# Patient Record
Sex: Female | Born: 2005 | Hispanic: Yes | Smoking: Never smoker
Health system: Southern US, Community
[De-identification: ages and names within clinical notes are randomized; demographics above are authoritative.]

## PROBLEM LIST (undated history)

## (undated) DIAGNOSIS — K37 Unspecified appendicitis: Secondary | ICD-10-CM

## (undated) HISTORY — PX: APPENDECTOMY: SHX54

## (undated) HISTORY — DX: Unspecified appendicitis: K37

---
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
## 2023-09-05 IMAGING — CR DG ABDOMEN 2V
3 series · 3 of 3 positions shown · non-contrast
Comparison: None.

CLINICAL DATA: Cramping in the abdominal area

EXAM:
ABDOMEN - 2 VIEW

[abdomen erect]
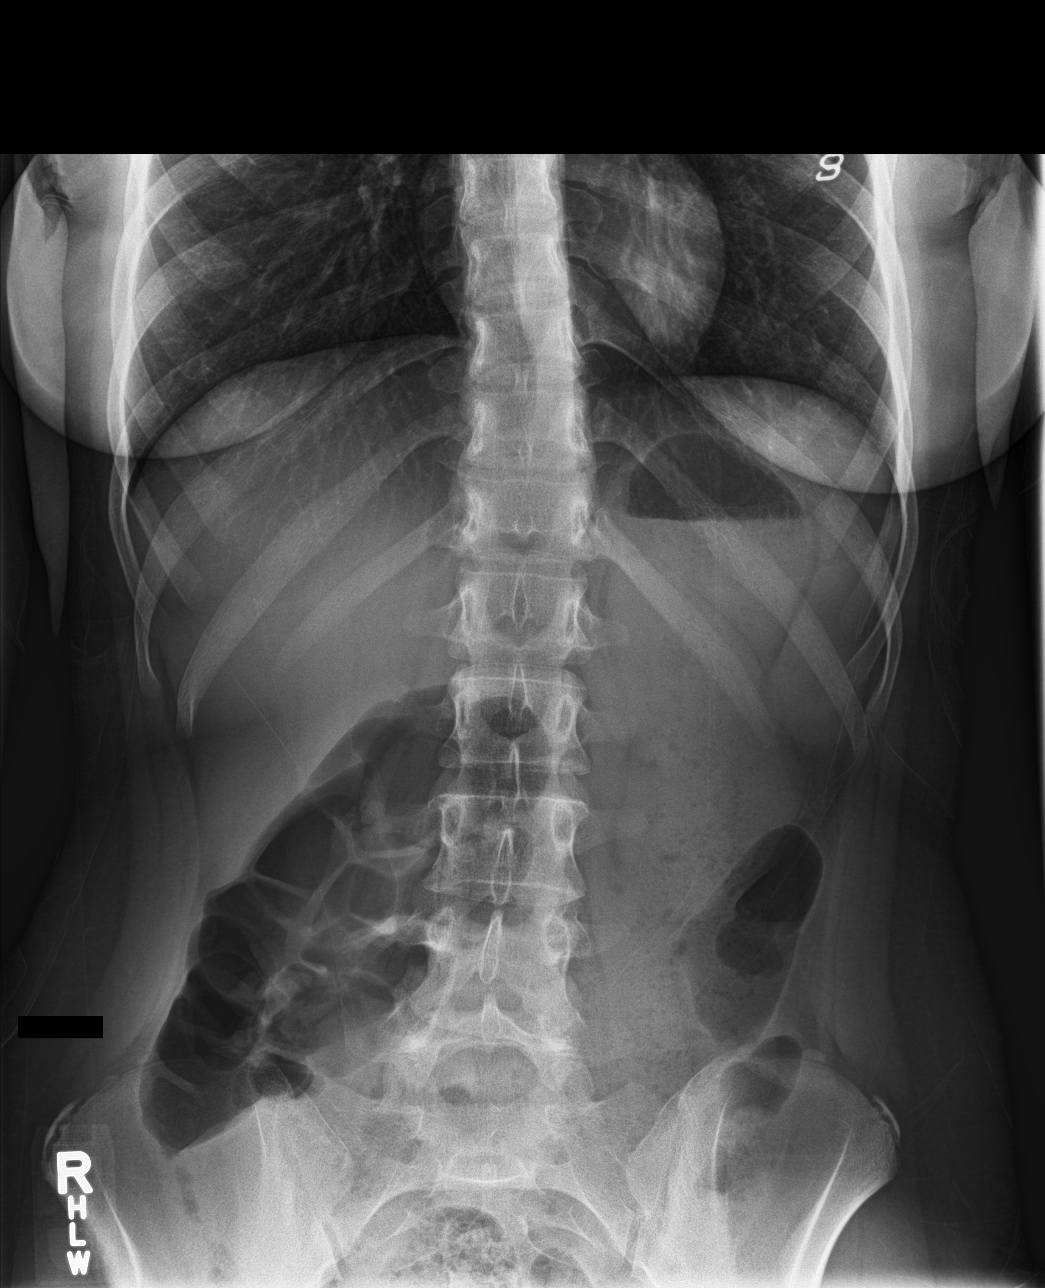

[abdomen supine (1 of 2)]
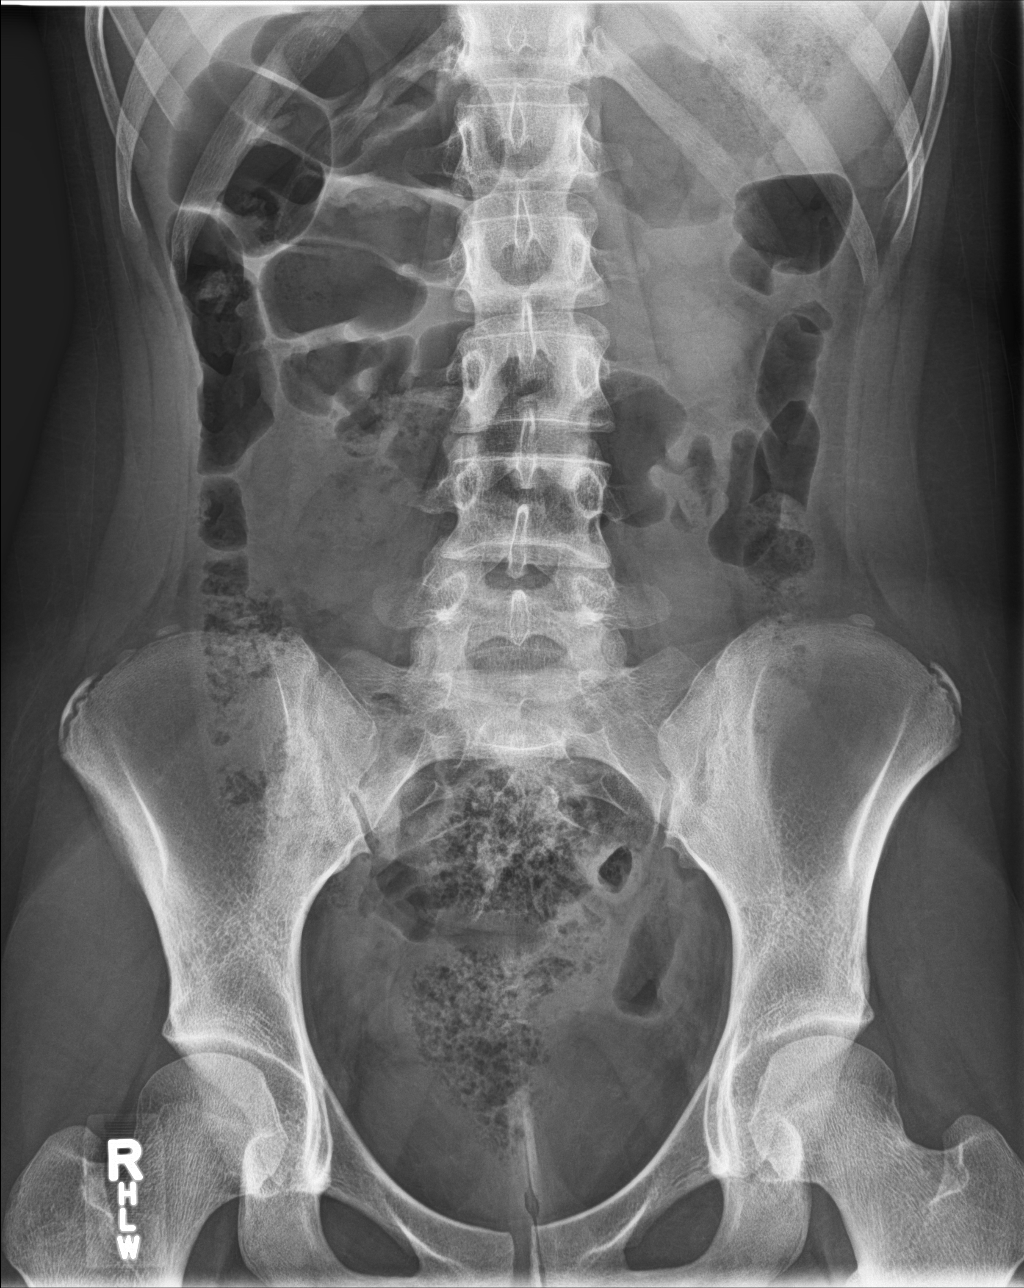

[abdomen supine (2 of 2)]
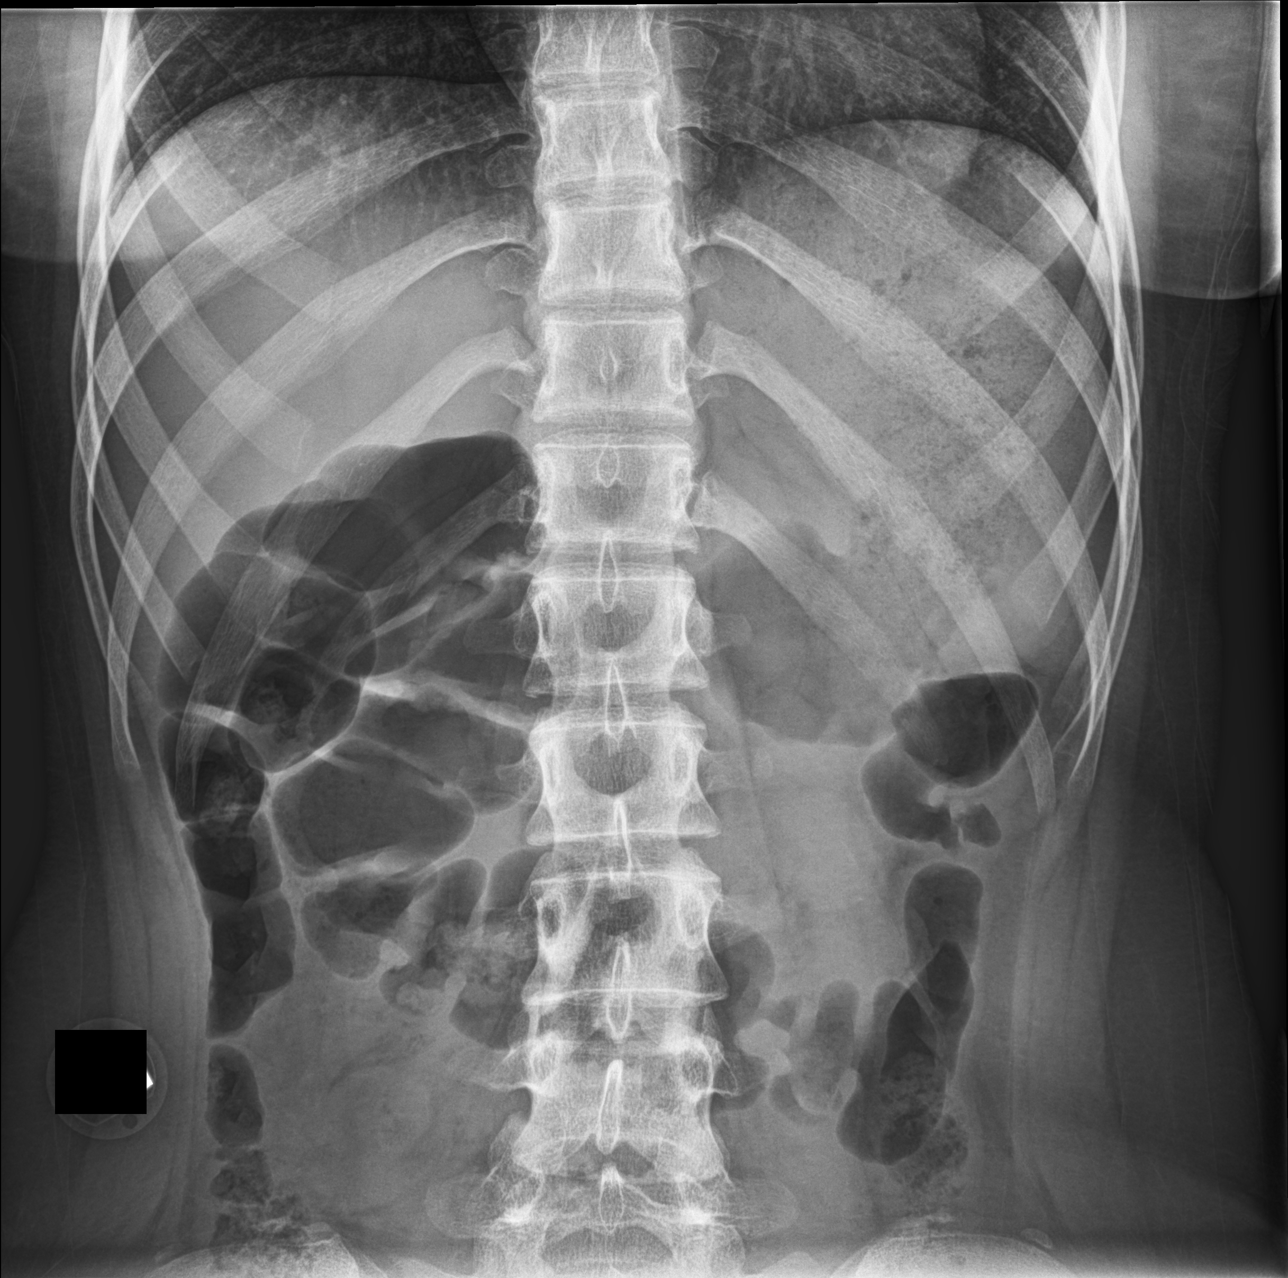

[3 of 3 positions shown; findings below may reference images not displayed]

FINDINGS: The bowel gas pattern is normal. There is no evidence of free air.
No radio-opaque calculi or other significant radiographic
abnormality is seen. Mild to moderate stool in the colon.
IMPRESSION: Negative.
